# Patient Record
Sex: Male | Born: 1965 | Race: White | Hispanic: No | Marital: Married | Smoking: Never smoker
Health system: Southern US, Community
[De-identification: ages and names within clinical notes are randomized; demographics above are authoritative.]

## PROBLEM LIST (undated history)

## (undated) DIAGNOSIS — T7840XA Allergy, unspecified, initial encounter: Secondary | ICD-10-CM

## (undated) DIAGNOSIS — K219 Gastro-esophageal reflux disease without esophagitis: Secondary | ICD-10-CM

## (undated) DIAGNOSIS — B019 Varicella without complication: Secondary | ICD-10-CM

## (undated) HISTORY — DX: Allergy, unspecified, initial encounter: T78.40XA

## (undated) HISTORY — DX: Gastro-esophageal reflux disease without esophagitis: K21.9

## (undated) HISTORY — PX: TONSILLECTOMY: SUR1361

## (undated) HISTORY — DX: Varicella without complication: B01.9

---

## 2008-01-26 ENCOUNTER — Ambulatory Visit: Payer: Self-pay | Admitting: Occupational Medicine

## 2009-10-16 IMAGING — CR DG TOE GREAT 2+V*R*
2 series · 2 of 2 positions shown · non-contrast
Comparison: None

CLINICAL DATA: Pain, no acute injury

RIGHT TOE - 2+ VIEW

[view not recorded (1 of 2)]
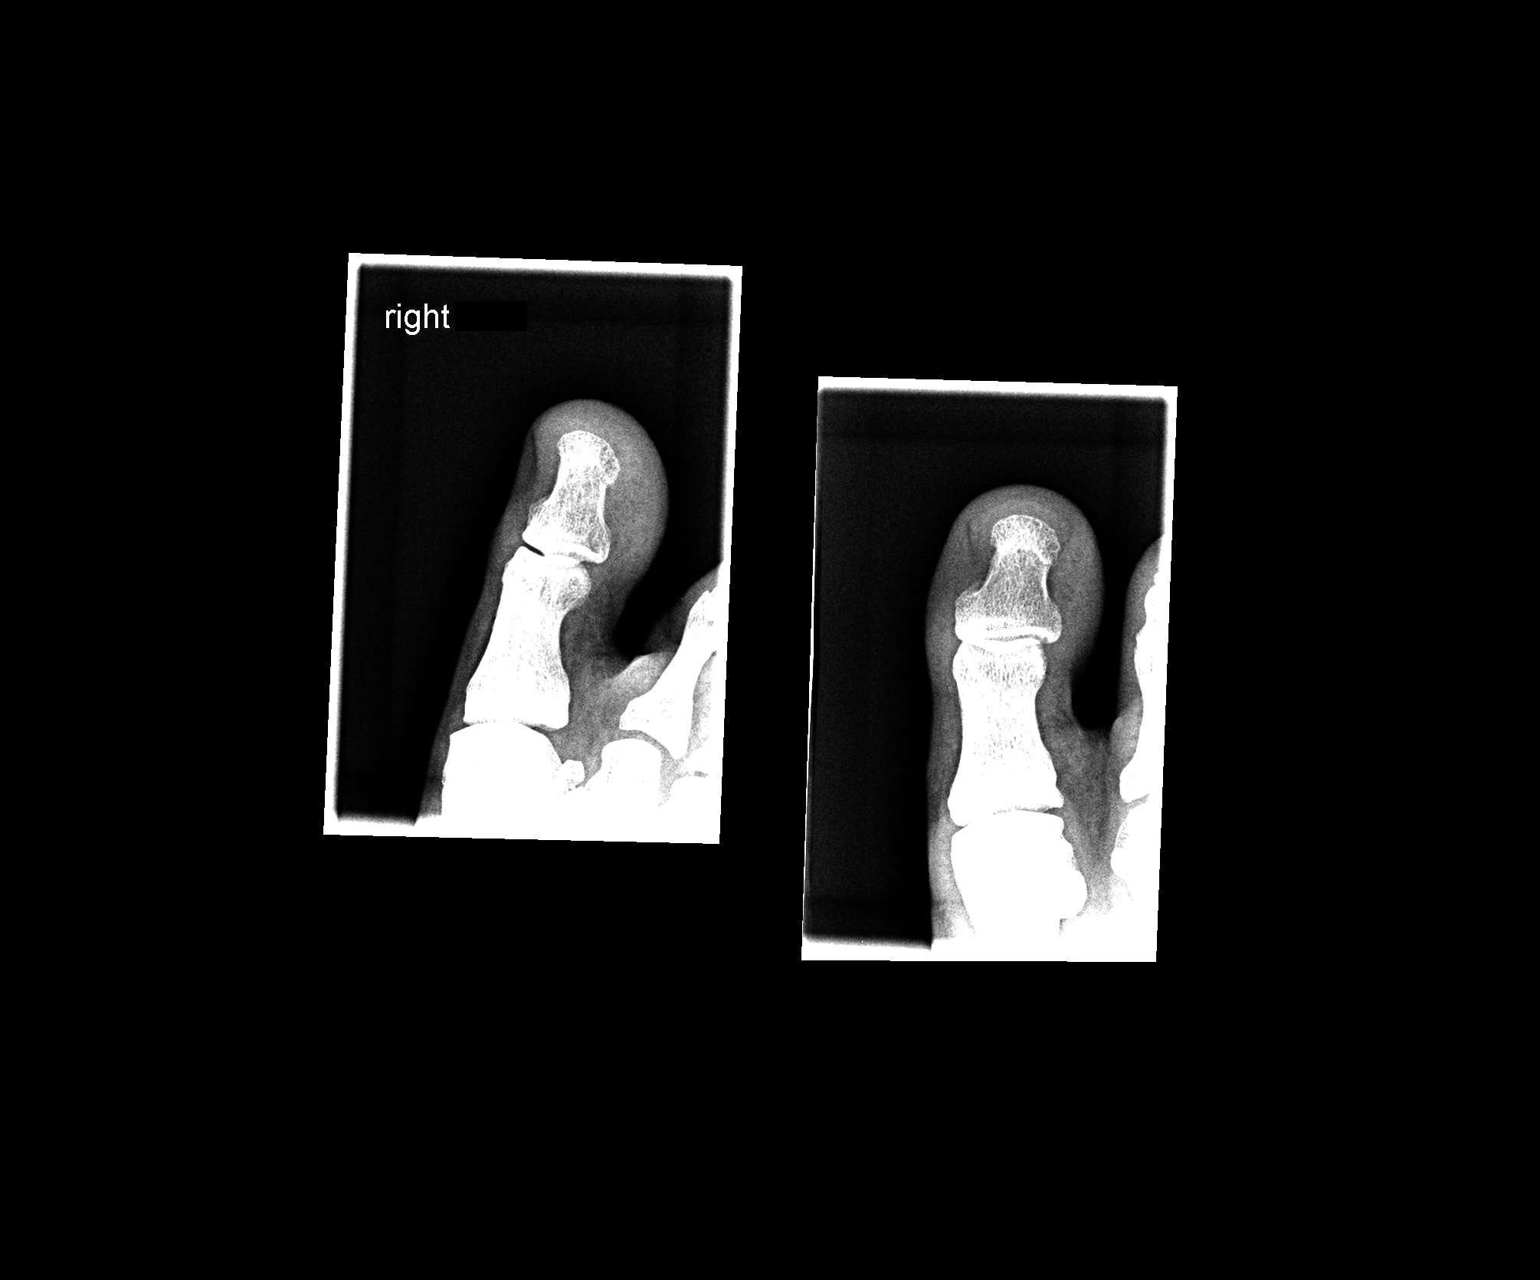

[view not recorded (2 of 2)]
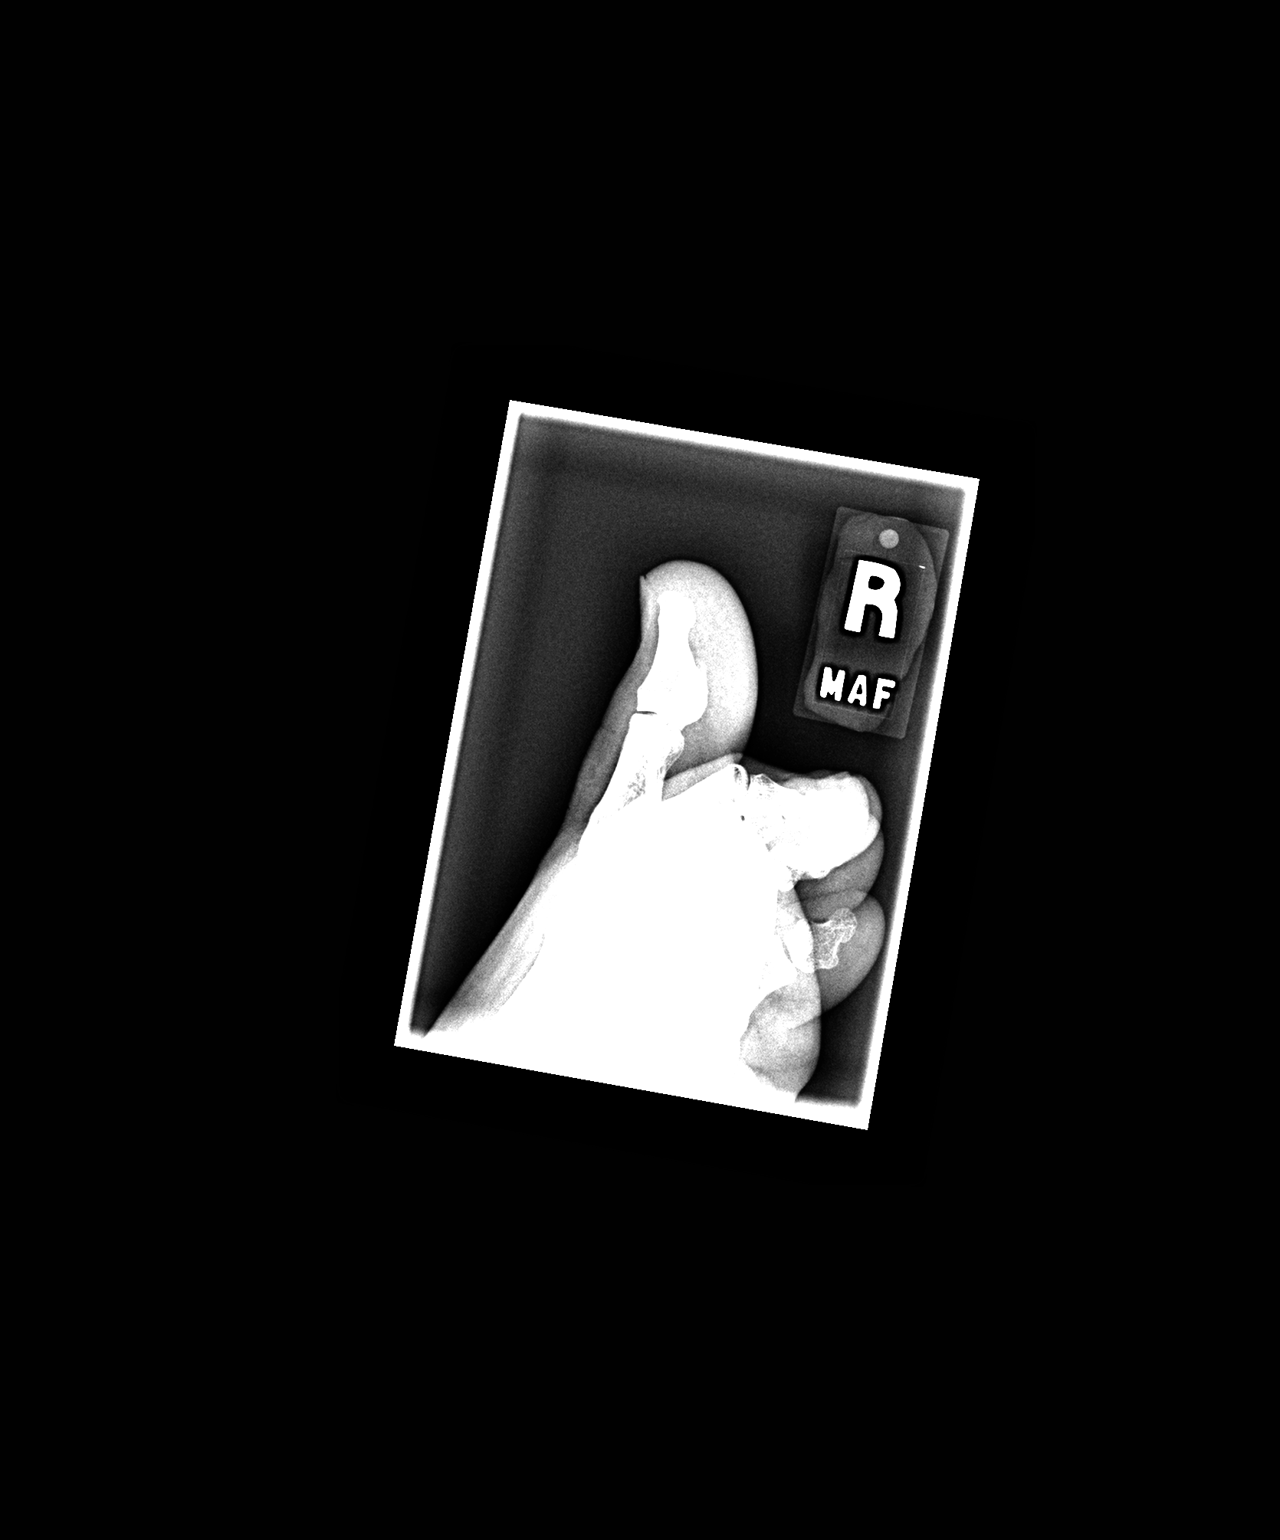

[2 of 2 positions shown; findings below may reference images not displayed]

FINDINGS: No acute bony abnormality is seen.  Alignment is normal.
Joint spaces are normal.  No erosion is seen.
IMPRESSION: Negative.

## 2015-05-18 ENCOUNTER — Ambulatory Visit (HOSPITAL_BASED_OUTPATIENT_CLINIC_OR_DEPARTMENT_OTHER)
Admission: RE | Admit: 2015-05-18 | Discharge: 2015-05-18 | Disposition: A | Discharge status: Home / Self Care | Payer: BLUE CROSS/BLUE SHIELD | Source: Ambulatory Visit | Attending: Medical | Admitting: Medical

## 2015-05-18 ENCOUNTER — Other Ambulatory Visit: Payer: Self-pay | Admitting: Medical

## 2015-05-18 ENCOUNTER — Telehealth: Payer: Self-pay | Admitting: Medical

## 2015-05-18 ENCOUNTER — Ambulatory Visit (INDEPENDENT_AMBULATORY_CARE_PROVIDER_SITE_OTHER): Payer: BLUE CROSS/BLUE SHIELD | Admitting: Medical

## 2015-05-18 ENCOUNTER — Encounter: Payer: Self-pay | Admitting: Medical

## 2015-05-18 VITALS — BP 124/84 | HR 96 | Temp 98.0°F | Resp 16 | Ht 70.0 in | Wt 204.0 lb

## 2015-05-18 DIAGNOSIS — M2550 Pain in unspecified joint: Secondary | ICD-10-CM

## 2015-05-18 DIAGNOSIS — M25852 Other specified joint disorders, left hip: Secondary | ICD-10-CM | POA: Diagnosis not present

## 2015-05-18 DIAGNOSIS — Z3009 Encounter for other general counseling and advice on contraception: Secondary | ICD-10-CM

## 2015-05-18 DIAGNOSIS — R0683 Snoring: Secondary | ICD-10-CM | POA: Diagnosis not present

## 2015-05-18 DIAGNOSIS — M25551 Pain in right hip: Secondary | ICD-10-CM

## 2015-05-18 DIAGNOSIS — R509 Fever, unspecified: Secondary | ICD-10-CM

## 2015-05-18 LAB — CBC WITH DIFFERENTIAL/PLATELET
BASOS ABS: 0.1 10*3/uL (ref 0.0–0.1)
Basophils Relative: 0.8 % (ref 0.0–3.0)
EOS ABS: 0.2 10*3/uL (ref 0.0–0.7)
Eosinophils Relative: 2 % (ref 0.0–5.0)
HEMATOCRIT: 43.2 % (ref 39.0–52.0)
Hemoglobin: 14.8 g/dL (ref 13.0–17.0)
LYMPHS PCT: 31.1 % (ref 12.0–46.0)
Lymphs Abs: 2.5 10*3/uL (ref 0.7–4.0)
MCHC: 34.2 g/dL (ref 30.0–36.0)
MCV: 89.7 fl (ref 78.0–100.0)
Monocytes Absolute: 0.7 10*3/uL (ref 0.1–1.0)
Monocytes Relative: 8.9 % (ref 3.0–12.0)
NEUTROS ABS: 4.7 10*3/uL (ref 1.4–7.7)
Neutrophils Relative %: 57.2 % (ref 43.0–77.0)
PLATELETS: 396 10*3/uL (ref 150.0–400.0)
RBC: 4.82 Mil/uL (ref 4.22–5.81)
RDW: 13.1 % (ref 11.5–15.5)
WBC: 8.2 10*3/uL (ref 4.0–10.5)

## 2015-05-18 LAB — URIC ACID: Uric Acid, Serum: 6 mg/dL (ref 4.0–7.8)

## 2015-05-18 LAB — C-REACTIVE PROTEIN: CRP: 4 mg/dL (ref 0.5–20.0)

## 2015-05-18 LAB — RHEUMATOID FACTOR

## 2015-05-18 LAB — SEDIMENTATION RATE: Sed Rate: 56 mm/hr — ABNORMAL HIGH (ref 0–22)

## 2015-05-18 MED ORDER — PREDNISONE 10 MG PO TABS: ORAL_TABLET | ORAL | Status: DC

## 2015-05-18 NOTE — Telephone Encounter (Signed)
rx prednisone sent in.

## 2015-05-18 NOTE — Progress Notes (Signed)
Pre visit review using our clinic review tool, if applicable. No additional management support is needed unless otherwise documented below in the visit note. 

## 2015-05-18 NOTE — Progress Notes (Addendum)
Subjective:    Patient ID: Alvin White, male    DOB: March 19, 1965, 50 y.o.   MRN: [id number]  HPI  I have reviewed pt PMH, PSH, FH, Social History and Surgical History  Works Tree [employer], No regular exercise, Likes to hike, drinks coffee about 2 cups a day. Pt eats relatively healthy. He eats fresh vegetable often at home. Married- 2 children. 15 yo and 51 yo.  Allergic rhinitis- mild seasonal both spring and fall.   Pt dad had rheumatoid arthritis. Also uncle had RA. Pt states rt great toe one year ago hurt a lot. But went away after 2 days. This past Tuesday he had some left hip pain. Wednesday walking with his [place] the zoo. Hurt for 3-4 days. Now pain gone but feels mild stiff today.   Rare heart burn. When he eats late at night.  Anxiety- mild low level and he considers normal variant.  Low level faint ha which he attributes to being fasting.  Pt states 2 years of snoring per wife. Pt states he wakes up feeling refreshed. But loud enough snore that keeps his wife up. When lying on his back it is the worse.  Pt also wants referral for vasectomy.   Pt states subjective fever on Friday. Temp 100.4. But now none. No rash on his hip. No cough. No urinary symptosm. No gi symptoms.    Review of Systems  Constitutional: Negative for fever, chills, diaphoresis, activity change and fatigue.  HENT: Negative for congestion, ear pain, sinus pressure and sore throat.   Respiratory: Negative for cough, chest tightness and shortness of breath.        Does snore.  Cardiovascular: Negative for chest pain, palpitations and leg swelling.  Gastrointestinal: Negative for nausea, vomiting and abdominal pain.  Genitourinary: Negative for dysuria and flank pain.  Musculoskeletal: Negative for myalgias, neck pain and neck stiffness.       Lt hip pain.  Skin: Negative for rash.  Neurological: Negative for dizziness, seizures, speech difficulty, weakness, numbness and headaches.    Hematological: Negative for adenopathy. Does not bruise/bleed easily.  Psychiatric/Behavioral: Negative for behavioral problems, confusion and agitation. The patient is not nervous/anxious.     Past Medical History  Diagnosis Date  . Chicken pox   . Allergy   . GERD (gastroesophageal reflux disease)      Social History   Social History  . Marital Status: Married    Spouse Name: N/A  . Number of Children: N/A  . Years of Education: N/A   Occupational History  . Not on file.   Social History Main Topics  . Smoking status: Never Smoker   . Smokeless tobacco: Never Used  . Alcohol Use: 1.2 - 1.8 oz/week    2-3 Standard drinks or equivalent per week  . Drug Use: No  . Sexual Activity: Not on file   Other Topics Concern  . Not on file   Social History Narrative  . No narrative on file    Past Surgical History  Procedure Laterality Date  . Tonsillectomy      Family History  Problem Relation Age of Onset  . Arthritis Mother   . Arthritis Father     No Known Allergies  No current outpatient prescriptions on file prior to visit.   No current facility-administered medications on file prior to visit.    BP 124/84 mmHg  Pulse 96  Temp(Src) 98 F (36.7 C) (Oral)  Resp 16  Ht 5\' 10"  (  1.778 m)  Wt 204 lb (92.534 kg)  BMI 29.27 kg/m2  SpO2 98%       Objective:   Physical Exam  General Mental Status- Alert. General Appearance- Not in acute distress.   Skin General: Color- Normal Color. Moisture- Normal Moisture.  Neck Carotid Arteries- Normal color. Moisture- Normal Moisture. No carotid bruits. No JVD.  Chest and Lung Exam Auscultation: Breath Sounds:-Normal. CTA.  Cardiovascular Auscultation:Rythm- Regular. Murmurs & Other Heart Sounds:Auscultation of the heart reveals- No Murmurs.  Abdomen Inspection:-Inspeection Normal. Palpation/Percussion:Note:No mass. Palpation and Percussion of the abdomen reveal- Non Tender, Non Distended + BS, no  rebound or guarding.    Neurologic Cranial Nerve exam:- CN III-XII intact(No nystagmus), symmetric smile. Strength:- 5/5 equal and symmetric strength both upper and lower extremities.  Lt hip- faint pain on palpation and rom.  Genital exam- no hernia on either side inguinal canals.      Assessment & Plan:  For your joint pain will get ra panel, uric acid and xray of lt hip. Currently [place] ibuprofen otc for pain.  For your fever on Friday will get cbc. If recurrent fever or new signs or symptoms let us know.  Will refer you to urologist for counseling on vasectomy.  Will refer to pulmonologist for evaluation of snoring.(possible sleep apnea)  Follow up 2-3 weeks for cpe.(come in fasting)

## 2015-05-18 NOTE — Addendum Note (Signed)
Addended by: Anabel Halon on: 05/18/2015 11:03 AM   Modules accepted: Orders

## 2015-05-18 NOTE — Patient Instructions (Addendum)
For your joint pain will get ra panel, uric acid and xray of lt hip. Currently [place] ibuprofen otc for pain.  For your fever on Friday will get cbc. If recurrent fever or new signs or symptoms let us know.  Will refer you to urologist for counseling on vasectomy.  Will refer to pulmonologist for evaluation of snoring.(possible sleep apnea)  Follow up 2-3 weeks for cpe.(come in fasting)

## 2015-05-19 ENCOUNTER — Telehealth: Payer: Self-pay | Admitting: Medical

## 2015-05-19 DIAGNOSIS — R700 Elevated erythrocyte sedimentation rate: Secondary | ICD-10-CM

## 2015-05-19 DIAGNOSIS — M2550 Pain in unspecified joint: Secondary | ICD-10-CM

## 2015-05-19 NOTE — Telephone Encounter (Signed)
Referral to rhuematologist made.

## 2015-05-19 NOTE — Telephone Encounter (Signed)
Which pulmonologist did you send the request to? Pt is curious. Who he will be seeing. Just talked to him and he is unaware who he is seening?

## 2015-05-19 NOTE — Telephone Encounter (Signed)
Pt minimizes pain in his hip now. I went over results with him. He does not want to take prednisone that I gave him. So I advised if hip pain worsens then go ahead and take. I will go ahead and refer to rheumatologist.

## 2015-05-19 NOTE — Telephone Encounter (Signed)
Pt spoke with pcp about results. He voices understanding.

## 2015-05-20 LAB — ANA: ANA: NEGATIVE

## 2015-06-24 ENCOUNTER — Telehealth: Payer: Self-pay | Admitting: Medical

## 2015-06-24 NOTE — Telephone Encounter (Signed)
I reviewed labs today ordered by Dr .Amil Amen. All studies were negative. Only mid lft eleavtion. Sign those labs and place for scanning.

## 2015-06-25 ENCOUNTER — Encounter: Payer: Self-pay | Admitting: Pulmonary Disease

## 2015-06-25 ENCOUNTER — Ambulatory Visit (INDEPENDENT_AMBULATORY_CARE_PROVIDER_SITE_OTHER): Payer: BLUE CROSS/BLUE SHIELD | Admitting: Pulmonary Disease

## 2015-06-25 VITALS — BP 128/82 | HR 93 | Ht 70.0 in | Wt 204.0 lb

## 2015-06-25 DIAGNOSIS — G4710 Hypersomnia, unspecified: Secondary | ICD-10-CM | POA: Diagnosis not present

## 2015-06-25 NOTE — Assessment & Plan Note (Signed)
Pt has snoring, gasping, choking, witnessed apneas.  Sleeps 7hrs/night. Feels some unrefreshed on waking up.  Some hypersomnia, esp end of the week.  (-) abn behavior in sleep.   Hypersomnia affects functionality.   ESS 5 currently but can get sleepy and tired end of the week.   We discussed about the diagnosis of Obstructive Sleep Apnea (OSA) and implications of untreated OSA. We discussed about CPAP and BiPaP as possible treatment options.   We will schedule the patient for a sleep study. We will try to get a home sleep study if insurance will cover.  If not covered, pt OK to do a lab study.    Patient was instructed to call the office if he/she has not heard back from the office 1-2 weeks after the sleep study.   Patient was instructed to call the office if he/she is having issues with the PAP device.   We discussed good sleep hygiene.   Patient was advised not to engage in activities requiring concentration and/or vigilance if he/she is sleepy.  Patient was advised not to drive if he/she is sleepy.

## 2015-06-25 NOTE — Patient Instructions (Signed)
It was a pleasure taking care of you today!  We will schedule you to have a sleep study to determine if you have sleep apnea.   We will get a home sleep test.  You will be instructed to come back to the office to get an apparatus to sleep with overnight.  Once we have the apparatus, it will usually take [place] 1-2 weeks to read the study and get back at you with results of the test.  Please give [place] a call in 2 weeks after your study if you do not hear back from [place].    If the sleep study is positive, we will order you a CPAP  machine.  Please call the office if you do NOT receive your machine in the next 1-2 weeks.   Please make sure you use your CPAP device everytime you sleep.  We will monitor the usage of your machine per your insurance requirement.  Your insurance company may take the machine from you if you are not using it regularly.   Please clean the mask, tubings, filter, water reservoir with soapy water every week.  Please use distilled water for the water reservoir.   Please call the office or your machine provider (DME company) if you are having issues with the device.   Return to clinic in 2-3 months with Dr. Corrie Dandy or NP

## 2015-06-25 NOTE — Progress Notes (Signed)
Subjective:    Patient ID: Alvin White, male    DOB: 07-24-1965, 50 y.o.   MRN: [id number]  HPI   This is the case of Alvin White, 50 y.o. Male, who was referred by Alvin White  in consultation regarding possible OSA.   As you very well know, patient is a non smoker, not known to have asthma or copd.  Pt has snoring, gasping, choking, witnessed apneas.  Sleeps 7hrs/night. Feels some unrefreshed on waking up.  Some hypersomnia, esp end of the week.  (-) abn behavior in sleep.   Hypersomnia affects functionality.   ESS 5      Review of Systems  Constitutional: Negative.  Negative for fever and unexpected weight change.  HENT: Negative.  Negative for congestion, dental problem, ear pain, nosebleeds, postnasal drip, rhinorrhea, sinus pressure, sneezing, sore throat and trouble swallowing.   Eyes: Negative.  Negative for redness and itching.  Respiratory: Negative.  Negative for cough, chest tightness, shortness of breath and wheezing.   Cardiovascular: Negative.  Negative for palpitations and leg swelling.  Gastrointestinal: Negative.  Negative for nausea and vomiting.  Endocrine: Negative.   Genitourinary: Negative.  Negative for dysuria.  Musculoskeletal: Positive for arthralgias. Negative for joint swelling.  Skin: Negative.  Negative for rash.  Allergic/Immunologic: Negative.   Neurological: Negative.  Negative for headaches.  Hematological: Negative.  Does not bruise/bleed easily.  Psychiatric/Behavioral: Negative.  Negative for dysphoric mood. The patient is not nervous/anxious.    Past Medical History  Diagnosis Date  . Chicken pox   . Allergy   . GERD (gastroesophageal reflux disease)    (-) DVT, CA  Family History  Problem Relation Age of Onset  . Arthritis Mother   . Arthritis Father    OSA in father.   Past Surgical History  Procedure Laterality Date  . Tonsillectomy      Social History   Social History  . Marital Status: Married   Spouse Name: N/A  . Number of Children: N/A  . Years of Education: N/A   Occupational History  . Not on file.   Social History Main Topics  . Smoking status: Never Smoker   . Smokeless tobacco: Never Used  . Alcohol Use: 1.2 - 1.8 oz/week    2-3 Standard drinks or equivalent per week  . Drug Use: No  . Sexual Activity: Not on file   Other Topics Concern  . 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   No Known Allergies   Outpatient Prescriptions Prior to Visit  Medication Sig Dispense Refill  . predniSONE (DELTASONE) 10 MG tablet 5 tab po day 1, 4 tab po day 2, 3 tab po day 3, 2 tab po day 4, 1 tab po day 5. 15 tablet 0   No facility-administered medications prior to visit.   No orders of the defined types were placed in this encounter.         Objective:   Physical Exam   Vitals:  Filed Vitals:   06/25/15 0931  BP: 128/82  Pulse: 93  Height: 5\' 10"  (1.778 m)  Weight: 204 lb (92.534 kg)  SpO2: 95%    Constitutional/General:  Pleasant, well-nourished, well-developed, not in any distress,  Comfortably seating.  Well kempt  Body mass index is 29.27 kg/(m^2). Wt Readings from Last 3 Encounters:  06/25/15 204 lb (92.534 kg)  05/18/15 204 lb (92.534 kg)  01/26/08 197 lb (89.359 kg)  Neck circumference: 15 inches  HEENT: Pupils equal and reactive to light and accommodation. Anicteric sclerae. Normal nasal mucosa.   No oral  lesions,  mouth clear,  oropharynx clear, no postnasal drip. (-) Oral thrush. No dental caries.  Airway - Mallampati class III  Neck: No masses. Midline trachea. No JVD, (-) LAD. (-) bruits appreciated.  Respiratory/Chest: Grossly normal chest. (-) deformity. (-) Accessory muscle use.  Symmetric expansion. (-) Tenderness on palpation.  Resonant on percussion.  Diminished BS on both lower lung zones. (-) wheezing, crackles, rhonchi (-) egophony  Cardiovascular: Regular  rate and  rhythm, heart sounds normal, no murmur or gallops, no peripheral edema  Gastrointestinal:  Normal bowel sounds. Soft, non-tender. No hepatosplenomegaly.  (-) masses.   Musculoskeletal:  Normal muscle tone. Normal gait.   Extremities: Grossly normal. (-) clubbing, cyanosis.  (-) edema  Skin: (-) rash,lesions seen.   Neurological/Psychiatric : alert, oriented to time, place, person. 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  Sleeps 7hrs/night. Feels some unrefreshed on waking up.  Some hypersomnia, esp end of the week.  (-) abn behavior in sleep.   Hypersomnia affects functionality.   ESS 5 currently but can get sleepy and tired end of the week.   We discussed about the diagnosis of Obstructive Sleep Apnea (OSA) and implications of untreated OSA. We discussed about CPAP and BiPaP as possible treatment options.   We will schedule the patient for a sleep study. We will try to get a home sleep study if insurance will cover.  If not covered, pt OK to do a lab study.    Patient was instructed to call the office if he/she has not heard back from the office 1-2 weeks after the sleep study.   Patient was instructed to call the office if he/she is having issues with the PAP device.   We discussed good sleep hygiene.   Patient was advised not to engage in activities requiring concentration and/or vigilance if he/she is sleepy.  Patient was advised not to drive if he/she is sleepy.       Thank you very much for letting me participate in this patient's care. Please do not hesitate to give me a call if you have any questions or concerns regarding the treatment plan.   Patient will follow up with me in 2-3 months    J. Shirl Harris, MD 06/25/2015   10:12 AM [facility] Pager: [phone] Office: [phone], Fax: [phone]

## 2015-07-01 ENCOUNTER — Other Ambulatory Visit: Payer: Self-pay | Admitting: Pulmonary Disease

## 2015-07-01 DIAGNOSIS — G4710 Hypersomnia, unspecified: Secondary | ICD-10-CM

## 2017-02-05 IMAGING — DX DG HIP (WITH OR WITHOUT PELVIS) 2-3V*L*
3 series · 3 of 3 positions shown · non-contrast
Comparison: None.

CLINICAL DATA: Pain for 1 week

EXAM:
DG HIP (WITH OR WITHOUT PELVIS) 2-3V LEFT

[pelvis ap]
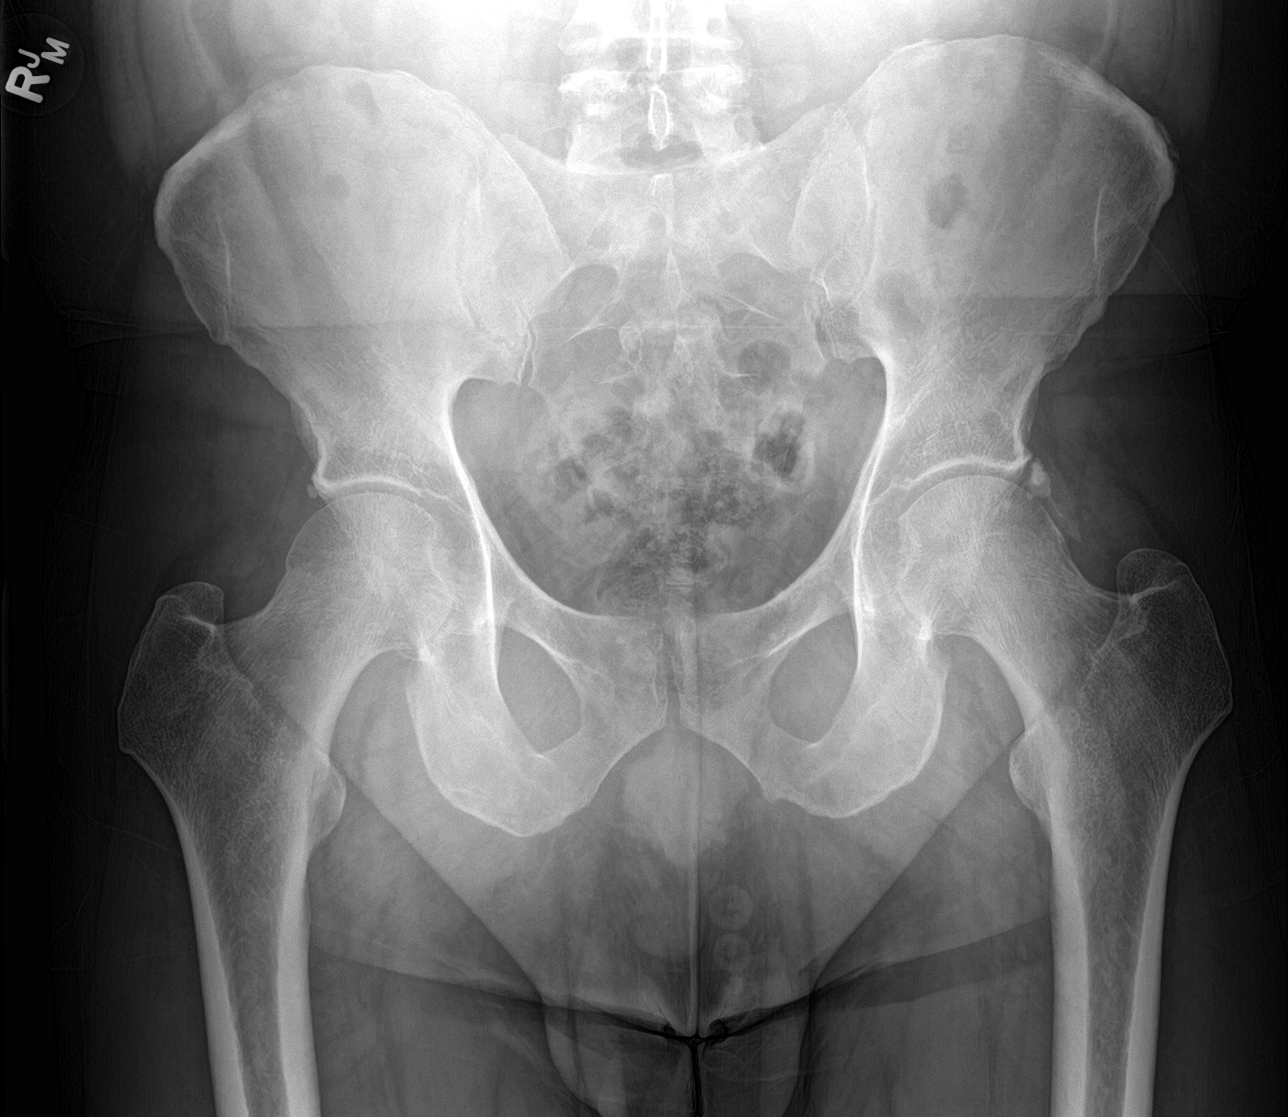

[hip ap]
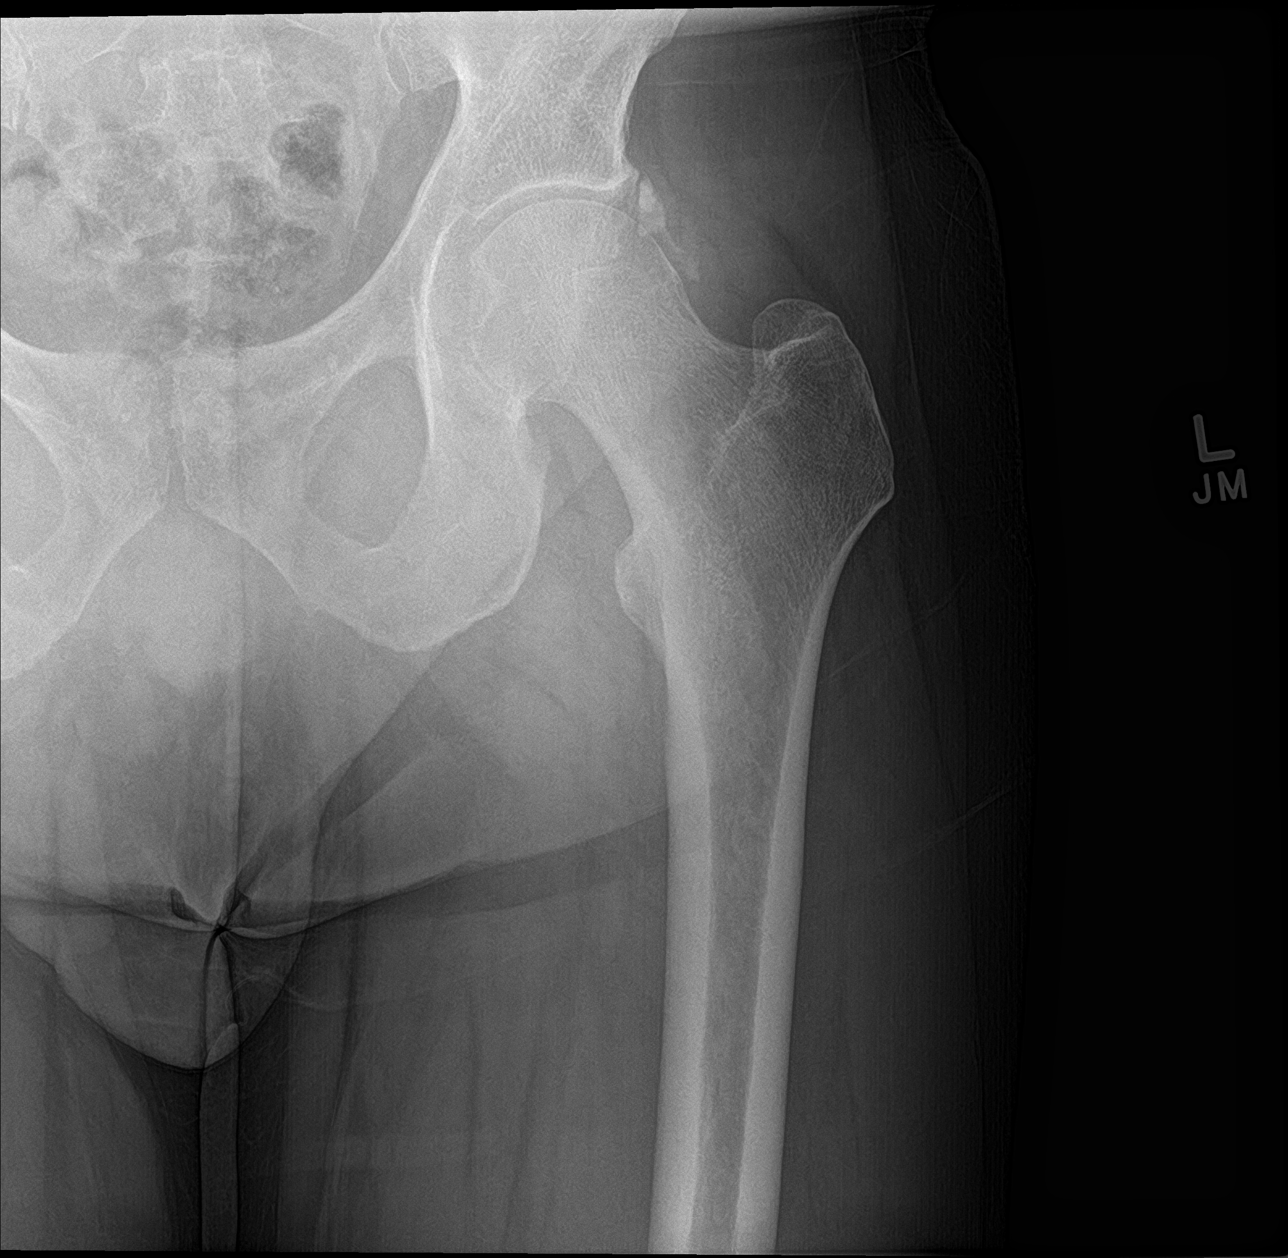

[hip lat]
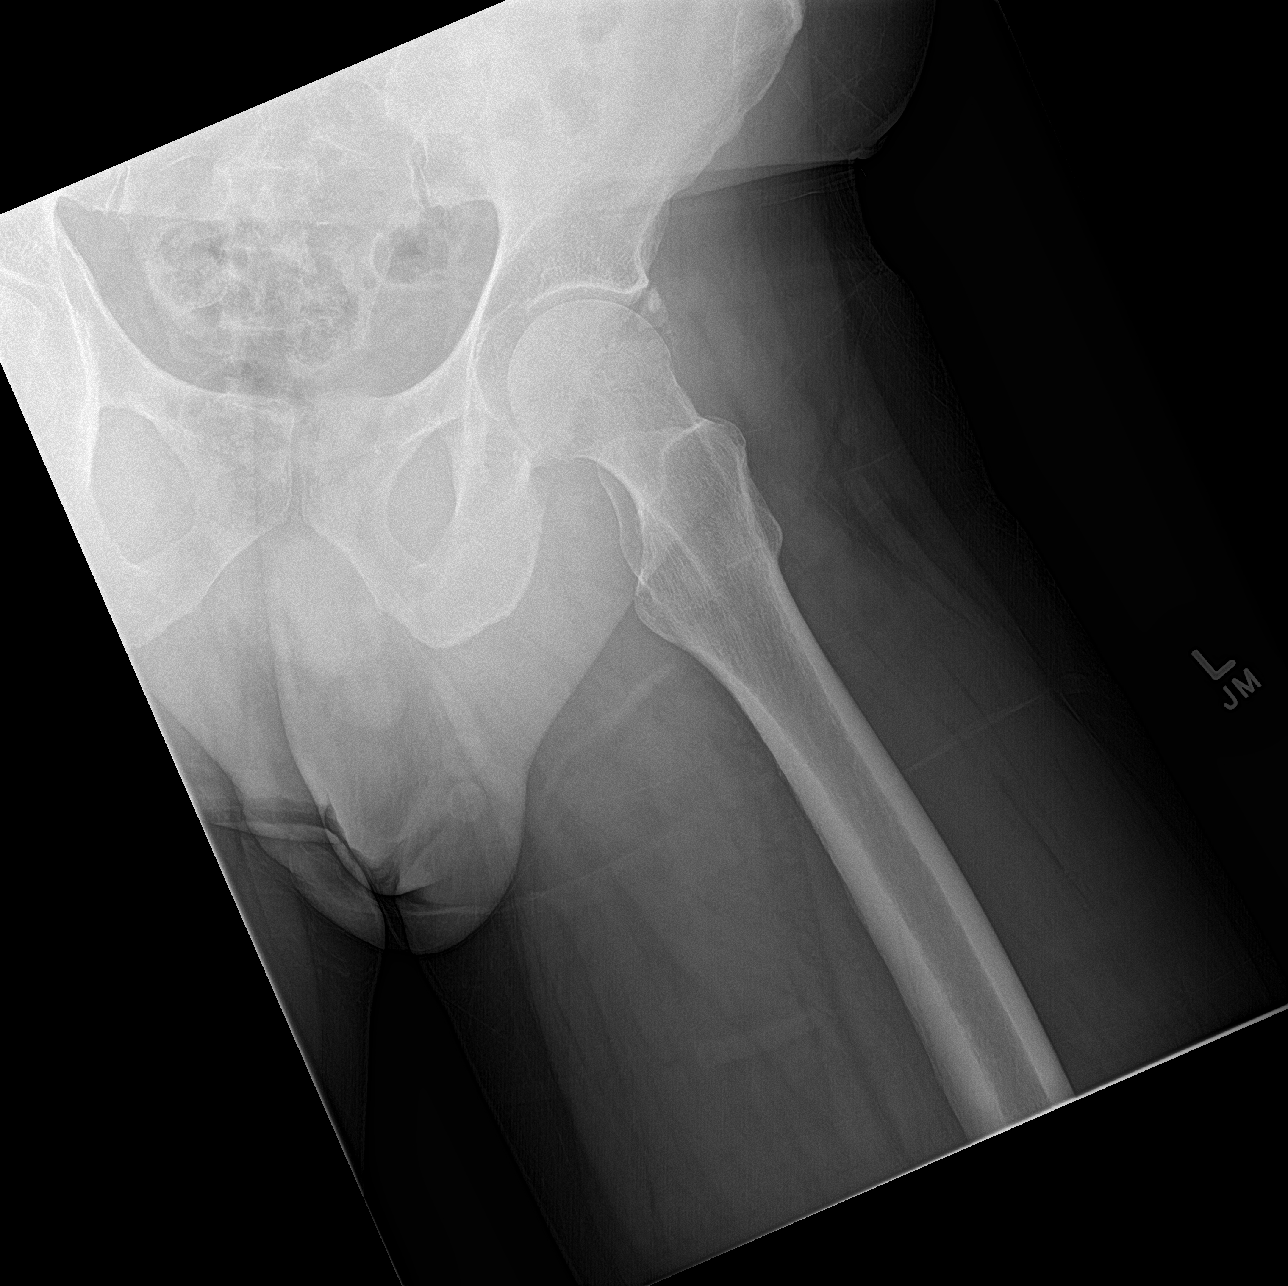

[3 of 3 positions shown; findings below may reference images not displayed]

FINDINGS: Frontal pelvis as well as frontal and lateral left hip images were
obtained. There is no fracture or dislocation. There is mild
intra-articular calcification in the lateral left hip joint region.
There is slight symmetric narrowing of both hip joints. No erosive
change.
IMPRESSION: Foci of calcification in the lateral left hip joint adjacent to the
superior acetabulum. Suspect arthropathic etiology, although residua
of prior trauma could present in this manner. No acute fracture or
dislocation. Slight symmetric narrowing of both hip joints.

## 2017-11-17 ENCOUNTER — Encounter: Payer: BLUE CROSS/BLUE SHIELD | Admitting: Medical

## 2018-04-23 ENCOUNTER — Ambulatory Visit (INDEPENDENT_AMBULATORY_CARE_PROVIDER_SITE_OTHER): Payer: BLUE CROSS/BLUE SHIELD | Admitting: Medical

## 2018-04-23 ENCOUNTER — Encounter: Payer: Self-pay | Admitting: Medical

## 2018-04-23 ENCOUNTER — Other Ambulatory Visit: Payer: Self-pay

## 2018-04-23 DIAGNOSIS — F419 Anxiety disorder, unspecified: Secondary | ICD-10-CM

## 2018-04-23 DIAGNOSIS — M79672 Pain in left foot: Secondary | ICD-10-CM

## 2018-04-23 DIAGNOSIS — Z1211 Encounter for screening for malignant neoplasm of colon: Secondary | ICD-10-CM

## 2018-04-23 DIAGNOSIS — J301 Allergic rhinitis due to pollen: Secondary | ICD-10-CM | POA: Diagnosis not present

## 2018-04-23 DIAGNOSIS — R12 Heartburn: Secondary | ICD-10-CM | POA: Diagnosis not present

## 2018-04-23 DIAGNOSIS — R0683 Snoring: Secondary | ICD-10-CM

## 2018-04-23 MED ORDER — LEVOCETIRIZINE DIHYDROCHLORIDE 5 MG PO TABS
5.0000 mg | ORAL_TABLET | Freq: Every evening | ORAL | 2 refills | Status: DC
Start: 2018-04-23 — End: 2018-06-26

## 2018-04-23 NOTE — Progress Notes (Signed)
Subjective:    Patient ID: Alvin White, male    DOB: Aug 29, 1965, 53 y.o.   MRN: [id number]  HPI Virtual Visit via Video Note  I connected with Alvin White on 04/23/18 at  8:40 AM EDT by a video enabled telemedicine application and verified that I am speaking with the correct person using two identifiers.   I discussed the limitations of evaluation and management by telemedicine and the availability of in person appointments. The patient expressed understanding and agreed to proceed.   History of Present Illness: I have reviewed pt PMH, PSH, FH, Social History and Surgical History  Works Tree surgeon, No regular exercise, Likes to hike, drinks coffee about 2 cups a day. Pt eats relatively healthy. He eats fresh vegetable often at home. Married- 2 children. 52 yo and 30 yo.  Allergic rhinitis- mild seasonal both spring and fall.   Pt dad had rheumatoid arthritis. Also uncle had RA. Pt states rt great toe one year ago hurt a lot. But went away after 2 days. This past Tuesday he had some left hip pain. Wednesday walking with his [place] the zoo. Hurt for 3-4 days. Now pain gone but feels mild stiff today.   Rare heart burn. When he eats late at night.  Anxiety- mild low level and he considers normal variant.      Observations/Objective: No acute distress.   Assessment and Plan: Good to see patient since it has been almost 3 years since her last visit.  Reviewed his no prior medical problems in the past.  He states recent seasonal allergy/pollen flare.  Describes mild flare so I went ahead and prescribe Xyzal antihistamine for him.  He declined Flonase nasal spray but I did also recommend nasal saline spray to clean out his nose after he does outside type yardwork.   He has history of rare occasional heartburn.  Advised healthy diet and he could use over-the-counter Pepcid if needed.  He still reports snoring so I did go ahead and make referral to  pulmonologist for sleep apnea evaluation.  Also he is aware that he is overdue for screening colonoscopy so I did place GI referral.  Explained to patient that we need to process referrals and see if specialist are doing a video visits prior to elective procedures that can be scheduled hopefully early summer.   For intermittent transient left foot pain, explained that he might have small neuroma or other diagnoses but difficult to say without evaluating the foot.  Explained that I could evaluate this on complete physical exam hopefully in the near future.  Also investigate whether or not insurances allow [place] to do virtual visit so we can get fasting lab work.  Follow-up date to be determined depending on if complete physical exams can be done virtually or when viral pandemic restrictions are eased up. Follow Up Instructions:    I discussed the assessment and treatment plan with the patient. The patient was provided an opportunity to ask questions and all were answered. The patient agreed with the plan and demonstrated an understanding of the instructions.   The patient was advised to call back or seek an in-person evaluation if the symptoms worsen or if the condition fails to improve as anticipated.  I provided 25+ minutes of non-face-to-face time during this encounter.   Mackie Pai, PA-C    Review of Systems  Constitutional: Negative for chills, fatigue and fever.  HENT: Positive for congestion, postnasal drip and sneezing. Negative for  drooling, ear pain, facial swelling, sinus pressure and sinus pain.        Snoring.  Respiratory: Negative for cough, chest tightness, shortness of breath and wheezing.   Cardiovascular: Negative for chest pain and palpitations.  Gastrointestinal: Negative for abdominal distention, abdominal pain, constipation, diarrhea and nausea.       Rare reflux.  Musculoskeletal: Negative for back pain, joint swelling, myalgias and neck stiffness.  Skin:  Negative for pallor and rash.  Neurological: Negative for dizziness, seizures, syncope, weakness and headaches.  Hematological: Negative for adenopathy. Does not bruise/bleed easily.  Psychiatric/Behavioral: Negative for behavioral problems, confusion, dysphoric mood and suicidal ideas. The patient is not nervous/anxious.        Objective:   Physical Exam  No acute distress      Assessment & Plan:

## 2018-04-23 NOTE — Patient Instructions (Signed)
Good to see patient since it has been almost 3 years since her last visit.  Reviewed his no prior medical problems in the past.  He states recent seasonal allergy/pollen flare.  Describes mild flare so I went ahead and prescribe Xyzal antihistamine for him.  He declined Flonase nasal spray but I did also recommend nasal saline spray to clean out his nose after he does outside type yardwork.  He has history of rare occasional heartburn.  Advised healthy diet and he could use over-the-counter Pepcid if needed.  He still reports snoring so I did go ahead and make referral to pulmonologist for sleep apnea evaluation.  Also he is aware that he is overdue for screening colonoscopy so I did place GI referral.  Explained to patient that we need to process referrals and see if specialist are doing a video visits prior to elective procedures that can be scheduled hopefully early summer.   For intermittent transient left foot pain, explained that he might have small neuroma or other diagnoses but difficult to say without evaluating the foot.  Explained that I could evaluate this on complete physical exam hopefully in the near future.  Also investigate whether or not insurances allow [place] to do virtual visit so we can get fasting lab work.  Follow-up date to be determined depending on if complete physical exams can be done virtually or when viral pandemic restrictions are eased up.

## 2018-06-26 ENCOUNTER — Other Ambulatory Visit: Payer: Self-pay

## 2018-06-26 ENCOUNTER — Ambulatory Visit: Payer: BLUE CROSS/BLUE SHIELD | Admitting: *Deleted

## 2018-06-26 VITALS — Ht 70.5 in | Wt 205.0 lb

## 2018-06-26 DIAGNOSIS — Z1211 Encounter for screening for malignant neoplasm of colon: Secondary | ICD-10-CM

## 2018-06-26 MED ORDER — NA SULFATE-K SULFATE-MG SULF 17.5-3.13-1.6 GM/177ML PO SOLN: ORAL | 0 refills | Status: DC

## 2018-06-26 NOTE — Progress Notes (Signed)
Patient's pre-visit was done today over the phone with the patient due to COVID-19 pandemic. Name,DOB and address verified. Insurance verified. Packet of Prep instructions mailed to patient including copy of a consent form and pre-procedure patient acknowledgement form-pt is aware. Suprep Coupon included. Patient understands to call us back with any questions or concerns.  Patient denies any allergies to eggs or soy. Patient denies any problems with anesthesia/sedation. Patient denies any oxygen use at home. Patient denies taking any diet/weight loss medications or blood thinners. EMMI education assisgned to patient on colonoscopy, this was explained and instructions given to patient.  Pt is aware that care partner will wait in the car during parking lot; if they feel like they will be too hot to wait in the car; they may wait in the lobby.  We want them to wear a mask (we do not have any that we can provide them), practice social distancing, and we will check their temperatures when they get here.  I did remind patient that their care partner needs to stay in the parking lot the entire time. Pt will wear mask into building

## 2018-07-17 ENCOUNTER — Telehealth: Payer: Self-pay | Admitting: Gastroenterology

## 2018-07-17 NOTE — Telephone Encounter (Signed)

## 2018-07-18 ENCOUNTER — Encounter: Payer: Self-pay | Admitting: Gastroenterology

## 2018-07-18 ENCOUNTER — Ambulatory Visit (AMBULATORY_SURGERY_CENTER): Payer: BLUE CROSS/BLUE SHIELD | Admitting: Gastroenterology

## 2018-07-18 ENCOUNTER — Other Ambulatory Visit: Payer: Self-pay

## 2018-07-18 VITALS — BP 154/102 | HR 74 | Temp 98.3°F | Resp 19 | Ht 70.0 in | Wt 204.0 lb

## 2018-07-18 DIAGNOSIS — D123 Benign neoplasm of transverse colon: Secondary | ICD-10-CM | POA: Diagnosis not present

## 2018-07-18 DIAGNOSIS — D125 Benign neoplasm of sigmoid colon: Secondary | ICD-10-CM | POA: Diagnosis not present

## 2018-07-18 DIAGNOSIS — Z1211 Encounter for screening for malignant neoplasm of colon: Secondary | ICD-10-CM

## 2018-07-18 DIAGNOSIS — K5730 Diverticulosis of large intestine without perforation or abscess without bleeding: Secondary | ICD-10-CM

## 2018-07-18 DIAGNOSIS — D128 Benign neoplasm of rectum: Secondary | ICD-10-CM | POA: Diagnosis not present

## 2018-07-18 DIAGNOSIS — K641 Second degree hemorrhoids: Secondary | ICD-10-CM

## 2018-07-18 MED ORDER — SODIUM CHLORIDE 0.9 % IV SOLN: 500.0000 mL | Freq: Once | INTRAVENOUS | Status: DC

## 2018-07-18 NOTE — Op Note (Signed)
[place] Patient Name: Alvin White Procedure Date: 07/18/2018 7:49 AM MRN: [id number] Endoscopist: Gerrit Heck , MD Age: 53 Referring MD:  Date of Birth: May 03, 1965 Gender: Male Account #: [id number] Procedure:                Colonoscopy Indications:              Screening for colorectal malignant neoplasm, This                            is the patient's first colonoscopy Medicines:                Monitored Anesthesia Care Procedure:                Pre-Anesthesia Assessment:                           - Prior to the procedure, a History and Physical                            was performed, and patient medications and                            allergies were reviewed. The patient's tolerance of                            previous anesthesia was also reviewed. The risks                            and benefits of the procedure and the sedation                            options and risks were discussed with the patient.                            All questions were answered, and informed consent                            was obtained. Prior Anticoagulants: The patient has                            taken no previous anticoagulant or antiplatelet                            agents. ASA Grade Assessment: II - A patient with                            mild systemic disease. After reviewing the risks                            and benefits, the patient was deemed in                            satisfactory condition to undergo the procedure.  After obtaining informed consent, the colonoscope                            was passed under direct vision. Throughout the                            procedure, the patient's blood pressure, pulse, and                            oxygen saturations were monitored continuously. The                            Colonoscope was introduced through the anus and                            advanced to the the terminal  ileum. The colonoscopy                            was technically difficult and complex due to a                            tortuous colon. Successful completion of the                            procedure was aided by using manual pressure. The                            patient tolerated the procedure well. The quality                            of the bowel preparation was adequate. The terminal                            ileum, ileocecal valve, appendiceal orifice, and                            rectum were photographed. Scope In: 8:15:44 AM Scope Out: 8:47:23 AM Scope Withdrawal Time: 0 hours 16 minutes 42 seconds  Total Procedure Duration: 0 hours 31 minutes 39 seconds  Findings:                 Hemorrhoids were found on perianal exam.                           A 18 mm polyp was found in the sigmoid colon. The                            polyp was pedunculated. The polyp was removed with                            a hot snare and retieved using a Roth net.                            Resection and  retrieval were complete. Estimated                            blood loss: none.                           A 5 mm polyp was found in the transverse colon. The                            polyp was sessile. The polyp was removed with a                            cold snare. Resection and retrieval were complete.                            Estimated blood loss was minimal.                           A 3 mm polyp was found in the rectum. The polyp was                            sessile. The polyp was removed with a cold snare.                            Resection and retrieval were complete. Estimated                            blood loss was minimal.                           Multiple small and large-mouthed diverticula were                            found in the sigmoid colon.                           The sigmoid colon was grossly tortuous in a field                            of dense  diverticulosis. Advancing the scope                            required using manual pressure.                           Non-bleeding internal hemorrhoids were found during                            retroflexion. The hemorrhoids were small.                           The terminal ileum appeared normal. Complications:            No immediate complications. Estimated Blood Loss:     Estimated blood loss was minimal. Impression:               -  Hemorrhoids found on perianal exam.                           - One 18 mm polyp in the sigmoid colon, removed                            with a hot snare. Resected and retrieved.                           - One 5 mm polyp in the transverse colon, removed                            with a cold snare. Resected and retrieved.                           - One 3 mm polyp in the rectum, removed with a cold                            snare. Resected and retrieved.                           - Diverticulosis in the sigmoid colon.                           - Tortuous colon.                           - Non-bleeding internal hemorrhoids.                           - The examined portion of the ileum was normal. Recommendation:           - Patient has a contact number available for                            emergencies. The signs and symptoms of potential                            delayed complications were discussed with the                            patient. Return to normal activities tomorrow.                            Written discharge instructions were provided to the                            patient.                           - Resume previous diet.                           - Continue present medications.                           -  Await pathology results.                           - Repeat colonoscopy in 3 years for surveillance.                           - Return to GI clinic PRN.                           - Use fiber, for example Citrucel,  Fibercon, Konsyl                            or Metamucil.                           - Internal hemorrhoids were noted on this study and                            may be amenable to hemorrhoid band ligation. If you                            are interested in further treatment of these                            hemorrhoids with band ligation, please contact my                            clinic to set up an appointment for evaluation and                            treatment. Gerrit Heck, MD 07/18/2018 8:58:55 AM

## 2018-07-18 NOTE — Patient Instructions (Signed)
INFORMATION ON POLYPS , DIVERTICULOSIS , &HEMORRHOIDS GIVEN TO YOU TODAY  INFORMATION ON HEMORRHOID BANDING GIVEN TO YOU TODAY - MAY CALL OFFICE AND SET UP AN APPOINTMENT FOR BANDING IF INTERESTED   AWAIT PATHOLOGY RESULTS ON POLYPS REMOVED  USE FIBER SUCH AS METAMUCIL,CITRUCEL,FIBERCON,OR KONSYL  YOU HAD AN ENDOSCOPIC PROCEDURE TODAY AT [facility]:   Refer to the procedure report that was given to you for any specific questions about what was found during the examination.  If the procedure report does not answer your questions, please call your gastroenterologist to clarify.  If you requested that your care partner not be given the details of your procedure findings, then the procedure report has been included in a sealed envelope for you to review at your convenience later.  YOU SHOULD EXPECT: Some feelings of bloating in the abdomen. Passage of more gas than usual.  Walking can help get rid of the air that was put into your GI tract during the procedure and reduce the bloating. If you had a lower endoscopy (such as a colonoscopy or flexible sigmoidoscopy) you may notice spotting of blood in your stool or on the toilet paper. If you underwent a bowel prep for your procedure, you may not have a normal bowel movement for a few days.  Please Note:  You might notice some irritation and congestion in your nose or some drainage.  This is from the oxygen used during your procedure.  There is no need for concern and it should clear up in a day or so.  SYMPTOMS TO REPORT IMMEDIATELY:   Following lower endoscopy (colonoscopy or flexible sigmoidoscopy):  Excessive amounts of blood in the stool  Significant tenderness or worsening of abdominal pains  Swelling of the abdomen that is new, acute  Fever of 100F or higher    Black, tarry-looking stools  For urgent or emergent issues, a gastroenterologist can be reached at any hour by calling [phone].   DIET:  We do recommend  a small meal at first, but then you may proceed to your regular diet.  Drink plenty of fluids but you should avoid alcoholic beverages for 24 hours.  ACTIVITY:  You should plan to take it easy for the rest of today and you should NOT DRIVE or use heavy machinery until tomorrow (because of the sedation medicines used during the test).    FOLLOW UP: Our staff will call the number listed on your records 48-72 hours following your procedure to check on you and address any questions or concerns that you may have regarding the information given to you following your procedure. If we do not reach you, we will leave a message.  We will attempt to reach you two times.  During this call, we will ask if you have developed any symptoms of COVID 19. If you develop any symptoms (ie: fever, flu-like symptoms, shortness of breath, cough etc.) before then, please call [phone].  If you test positive for Covid 19 in the 2 weeks post procedure, please call and report this information to [place].    If any biopsies were taken you will be contacted by phone or by letter within the next 1-3 weeks.  Please call us at [phone] if you have not heard about the biopsies in 3 weeks.    SIGNATURES/CONFIDENTIALITY: You and/or your care partner have signed paperwork which will be entered into your electronic medical record.  These signatures attest to the fact that that the information above  on your After Visit Summary has been reviewed and is understood.  Full responsibility of the confidentiality of this discharge information lies with you and/or your care-partner.

## 2018-07-18 NOTE — Progress Notes (Signed)
A and O x3. Report to RN. Tolerated MAC anesthesia well.

## 2018-07-18 NOTE — Progress Notes (Signed)
Pt's states no medical or surgical changes since previsit or office visit.  [facility] CMA and Judson Roch Angle Karel RN vitals. SM

## 2018-07-20 ENCOUNTER — Encounter: Payer: Self-pay | Admitting: Gastroenterology

## 2018-07-20 ENCOUNTER — Telehealth: Payer: Self-pay

## 2018-07-20 NOTE — Telephone Encounter (Signed)
Covid-19 screening questions   Do you now or have you had a fever in the last 14 days? no  Do you have any respiratory symptoms of shortness of breath or cough now or in the last 14 days? no  Do you have any family members or close contacts with diagnosed or suspected Covid-19 in the past 14 days? no  Have you been tested for Covid-19 and found to be positive? no       Follow up Call-  Call back number 07/18/2018  Post procedure Call Back phone  # [phone]  Permission to leave phone message Yes  Some recent data might be hidden     Patient questions:  Do you have a fever, pain , or abdominal swelling? No. Pain Score  0 *  Have you tolerated food without any problems? Yes.    Have you been able to return to your normal activities? Yes.    Do you have any questions about your discharge instructions: Diet   No. Medications  No. Follow up visit  No.  Do you have questions or concerns about your Care? No.  Actions: * If pain score is 4 or above: No action needed, pain <4.

## 2020-01-21 ENCOUNTER — Emergency Department (INDEPENDENT_AMBULATORY_CARE_PROVIDER_SITE_OTHER)
Admission: EM | Admit: 2020-01-21 | Discharge: 2020-01-21 | Disposition: A | Discharge status: Home / Self Care | Payer: 59 | Source: Home / Self Care

## 2020-01-21 DIAGNOSIS — J988 Other specified respiratory disorders: Secondary | ICD-10-CM

## 2020-01-21 DIAGNOSIS — B9789 Other viral agents as the cause of diseases classified elsewhere: Secondary | ICD-10-CM

## 2020-01-21 NOTE — ED Provider Notes (Signed)
[name]    CSN: [id number] Arrival date & time: 01/21/20  0835      History   Chief Complaint Chief Complaint  Patient presents with  . Sore Throat  . Sneezing  . Cough    HPI Alvin White is a 55 y.o. male.   HPI  Patient presents with URI symptoms including cough, sore throat, headache,nasal congestion. Symptoms present x 5 days. Unknown of COVID exposure. COVID Vaccinated: Y Denies worrisome symptoms of shortness of breath, weakness, N&V, and chest pain. Past Medical History:  Diagnosis Date  . Allergy   . Chicken pox   . GERD (gastroesophageal reflux disease)     Patient Active Problem List   Diagnosis Date Noted  . Hypersomnia 06/25/2015    Past Surgical History:  Procedure Laterality Date  . TONSILLECTOMY         Home Medications    Prior to Admission medications   Not on File    Family History Family History  Problem Relation Age of Onset  . Arthritis Mother   . Arthritis Father   . Colon polyps Father   . Esophageal cancer Neg Hx   . Rectal cancer Neg Hx   . Stomach cancer Neg Hx   . Colon cancer Neg Hx     Social History Social History   Tobacco Use  . Smoking status: Never Smoker  . Smokeless tobacco: Never Used  Vaping Use  . Vaping Use: Never used  Substance Use Topics  . Alcohol use: Yes    Alcohol/week: 14.0 standard drinks    Types: 7 Cans of beer, 7 Glasses of wine per week  . Drug use: No     Allergies   Patient has no known allergies.   Review of Systems Review of Systems Pertinent negatives listed in HPI  Physical Exam Triage Vital Signs ED Triage Vitals  Enc Vitals Group     BP 01/21/20 0908 (!) 147/104     Pulse Rate 01/21/20 0908 97     Resp 01/21/20 0908 18     Temp 01/21/20 0908 98.8 F (37.1 C)     Temp Source 01/21/20 0908 Oral     SpO2 01/21/20 0908 98 %     Weight --      Height --      Head Circumference --      Peak Flow --      Pain Score 01/21/20 0910 0     Pain  Loc --      Pain Edu? --      Excl. in [place]? --    No data found.  Updated Vital Signs BP (!) 147/104 (BP Location: Right Arm)   Pulse 97   Temp 98.8 F (37.1 C) (Oral)   Resp 18   SpO2 98%   Visual Acuity Right Eye Distance:   Left Eye Distance:   Bilateral Distance:    Right Eye Near:   Left Eye Near:    Bilateral Near:     Physical Exam General appearance: alert, well developed, well nourished, cooperative  Head: Normocephalic, without obvious abnormality, atraumatic ENT: mild congestion present  Respiratory: Respirations even and unlabored, normal respiratory rate, CTABL Heart: rate and rhythm normal. No gallop or murmurs noted on exam  Extremities: No gross deformities Skin: Skin color, texture, turgor normal. No rashes seen  Psych: Appropriate mood and affect.  UC Treatments / Results  Labs (all labs ordered are listed, but only abnormal results are displayed)  Labs Reviewed  COVID-19, FLU A+B NAA    EKG   Radiology No results found.  Procedures Procedures (including critical care time)  Medications Ordered in UC Medications - No data to display  Initial Impression / Assessment and Plan / UC Course  I have reviewed the triage vital signs and the nursing notes.  Pertinent labs & imaging results that were available during my care of the patient were reviewed by me and considered in my medical decision making (see chart for details).     COVID/Flu test pending.  Patient has already quarantine as of today a total of 5 days therefore regardless of test result quarantine has  ended and mask wearing is indicated. Symptom management warranted only.  Manage fever with Tylenol and ibuprofen.  Nasal symptoms with over-the-counter antihistamines recommended.  Treatment per discharge medications/discharge instructions.  Red flags/ER precautions given. The most current CDC isolation/quarantine recommendation advised.  Final Clinical Impressions(s) / UC Diagnoses    Final diagnoses:  Viral respiratory illness     Discharge Instructions     Your COVID 19 results will be available in 3-5 days .Negative results are immediately resulted to Mychart. Positive results will receive a follow-up call from our clinic. If symptoms are present, I recommend home quarantine until results are known.     ED Prescriptions    None     PDMP not reviewed this encounter.   Scot Jun, FNP 01/21/20 1022

## 2020-01-21 NOTE — ED Triage Notes (Signed)
Pt c/o cough, sore throat, headache and sneezing since Friday. No known exposure.

## 2020-01-21 NOTE — Discharge Instructions (Addendum)
Your COVID 19 results will be available in 3-5 days. Negative results are immediately resulted to Mychart. Positive results will receive a follow-up call from our clinic. If symptoms are present, I recommend home quarantine until results are known.  °

## 2020-01-24 LAB — COVID-19, FLU A+B NAA
Influenza A, NAA: NOT DETECTED
Influenza B, NAA: NOT DETECTED
SARS-CoV-2, NAA: DETECTED — AB

## 2020-10-02 ENCOUNTER — Encounter: Payer: Self-pay | Admitting: Gastroenterology

## 2022-07-06 ENCOUNTER — Ambulatory Visit
Admission: EM | Admit: 2022-07-06 | Discharge: 2022-07-06 | Disposition: A | Discharge status: Home / Self Care | Payer: Commercial Managed Care - PPO | Attending: Family Medicine | Admitting: Family Medicine

## 2022-07-06 ENCOUNTER — Encounter: Payer: Self-pay | Admitting: Emergency Medicine

## 2022-07-06 DIAGNOSIS — J069 Acute upper respiratory infection, unspecified: Secondary | ICD-10-CM

## 2022-07-06 NOTE — ED Provider Notes (Signed)
[facility]    CSN: [id number] Arrival date & time: 07/06/22  4098      History   Chief Complaint Chief Complaint  Patient presents with   Cough    HPI Alvin White is a 57 y.o. male.   Patient reports that he returned from a trip to [place] two weeks ago.  While there he developed mild sinus congestion that he attributed to allergy, mild brief sore throat, fatigue and mild cough.  He denies fevers, chills, and sweats, myalgias, headache, pleuritic pain, and shortness of breath.  He states that he now generally feels well except for mild persistent fatigue because his non-productive cough awakens him at night.  He has had no GI or GU symptoms.  The history is provided by the patient.    Past Medical History:  Diagnosis Date   Allergy    Chicken pox    GERD (gastroesophageal reflux disease)     Patient Active Problem List   Diagnosis Date Noted   Hypersomnia 06/25/2015    Past Surgical History:  Procedure Laterality Date   TONSILLECTOMY         Home Medications    Prior to Admission medications   Not on File    Family History Family History  Problem Relation Age of Onset   Arthritis Mother    Arthritis Father    Colon polyps Father    Esophageal cancer Neg Hx    Rectal cancer Neg Hx    Stomach cancer Neg Hx    Colon cancer Neg Hx     Social History Social History   Tobacco Use   Smoking status: Never   Smokeless tobacco: Never  Vaping Use   Vaping Use: Never used  Substance Use Topics   Alcohol use: Yes    Alcohol/week: 14.0 standard drinks of alcohol    Types: 7 Cans of beer, 7 Glasses of wine per week   Drug use: No     Allergies   Patient has no known allergies.   Review of Systems Review of Systems No sore throat + cough No pleuritic pain No wheezing + nasal congestion No post-nasal drainage No sinus pain/pressure No itchy/red eyes No earache No hemoptysis No SOB No fever/chills No nausea No  vomiting No abdominal pain No diarrhea No urinary symptoms No skin rash + fatigue No myalgias No headache   Physical Exam Triage Vital Signs ED Triage Vitals  Enc Vitals Group     BP 07/06/22 0838 133/85     Pulse Rate 07/06/22 0838 90     Resp 07/06/22 0838 14     Temp 07/06/22 0838 98.2 F (36.8 C)     Temp Source 07/06/22 0838 Oral     SpO2 07/06/22 0838 97 %     Weight --      Height 07/06/22 0840 5\' 10"  (1.778 m)     Head Circumference --      Peak Flow --      Pain Score 07/06/22 0844 0     Pain Loc --      Pain Edu? --      Excl. in [place]? --    No data found.  Updated Vital Signs BP 133/85 (BP Location: Left Arm)   Pulse 90   Temp 98.2 F (36.8 C) (Oral)   Resp 14   Ht 5\' 10"  (1.778 m)   SpO2 97%   BMI 29.27 kg/m   Visual Acuity Right Eye Distance:  Left Eye Distance:   Bilateral Distance:    Right Eye Near:   Left Eye Near:    Bilateral Near:     Physical Exam Nursing notes and Vital Signs reviewed. Appearance:  Patient appears stated age, and in no acute distress Eyes:  Pupils are equal, round, and reactive to light and accomodation.  Extraocular movement is intact.  Conjunctivae are not inflamed  Ears:  Canals normal.  Tympanic membranes normal.  Nose:  Mildly congested turbinates.  No sinus tenderness.  Pharynx:  Normal Neck:  Supple.  No adenopathy.  Lungs:  Clear to auscultation.  Breath sounds are equal.  Moving air well. Heart:  Regular rate and rhythm without murmurs, rubs, or gallops.  Abdomen:  Nontender without masses or hepatosplenomegaly.  Bowel sounds are present.  No CVA or flank tenderness.  Extremities:  No edema.  Skin:  No rash present.   UC Treatments / Results  Labs (all labs ordered are listed, but only abnormal results are displayed) Labs Reviewed - No data to display  EKG   Radiology No results found.  Procedures Procedures (including critical care time)  Medications Ordered in UC Medications - No data to  display  Initial Impression / Assessment and Plan / UC Course  I have reviewed the triage vital signs and the nursing notes.  Pertinent labs & imaging results that were available during my care of the patient were reviewed by me and considered in my medical decision making (see chart for details).    Benign exam.  There is no evidence of bacterial infection today.  Suspect a post-infectious cough. Treat symptomatically for now. Call if fever develops or symptoms worsen.   Final Clinical Impressions(s) / UC Diagnoses   Final diagnoses:  Viral URI with cough     Discharge Instructions      Take plain guaifenesin (1200mg  extended release tabs such as Mucinex) twice daily, with plenty of water, for cough and congestion.  Get adequate rest.  May take Delsym Cough Suppressant ("12 Hour Cough Relief") at bedtime for nighttime cough.   For sinus congestion may use Afrin nasal spray (or generic oxymetazoline) each morning for about 5 days and then discontinue.  Also recommend using saline nasal spray several times daily and saline nasal irrigation (AYR is a common brand).  Use Flonase nasal spray each morning after using Afrin nasal spray and saline nasal irrigation. Stop all antihistamines for now, and other non-prescription cough/cold preparations.      ED Prescriptions   None       Lattie Haw, MD 07/06/22 [phone]

## 2022-07-06 NOTE — Discharge Instructions (Addendum)
Take plain guaifenesin (1200mg  extended release tabs such as Mucinex) twice daily, with plenty of water, for cough and congestion.  Get adequate rest.  May take Delsym Cough Suppressant ("12 Hour Cough Relief") at bedtime for nighttime cough.   For sinus congestion may use Afrin nasal spray (or generic oxymetazoline) each morning for about 5 days and then discontinue.  Also recommend using saline nasal spray several times daily and saline nasal irrigation (AYR is a common brand).  Use Flonase nasal spray each morning after using Afrin nasal spray and saline nasal irrigation. Stop all antihistamines for now, and other non-prescription cough/cold preparations.

## 2022-07-06 NOTE — ED Triage Notes (Signed)
Cough since returning from [place]  Returned from [place] 2 weeks ago  Pt also had congestion at that time too No meds for cough  Cough keeps patient awake at night

## 2022-07-20 ENCOUNTER — Encounter: Payer: Self-pay | Admitting: Medical

## 2022-07-20 ENCOUNTER — Ambulatory Visit: Payer: Commercial Managed Care - PPO | Admitting: Medical

## 2022-07-20 VITALS — BP 135/82 | HR 91 | Ht 70.0 in | Wt 205.4 lb

## 2022-07-20 DIAGNOSIS — Z125 Encounter for screening for malignant neoplasm of prostate: Secondary | ICD-10-CM

## 2022-07-20 DIAGNOSIS — Z23 Encounter for immunization: Secondary | ICD-10-CM | POA: Diagnosis not present

## 2022-07-20 DIAGNOSIS — Z1211 Encounter for screening for malignant neoplasm of colon: Secondary | ICD-10-CM

## 2022-07-20 DIAGNOSIS — Z0000 Encounter for general adult medical examination without abnormal findings: Secondary | ICD-10-CM

## 2022-07-20 LAB — CBC WITH DIFFERENTIAL/PLATELET
Basophils Absolute: 0.1 10*3/uL (ref 0.0–0.1)
Basophils Relative: 0.7 % (ref 0.0–3.0)
Eosinophils Absolute: 0.3 10*3/uL (ref 0.0–0.7)
Eosinophils Relative: 3.5 % (ref 0.0–5.0)
HCT: 46.6 % (ref 39.0–52.0)
Hemoglobin: 15.4 g/dL (ref 13.0–17.0)
Lymphocytes Relative: 38.6 % (ref 12.0–46.0)
Lymphs Abs: 3.5 10*3/uL (ref 0.7–4.0)
MCHC: 33.1 g/dL (ref 30.0–36.0)
MCV: 92.3 fl (ref 78.0–100.0)
Monocytes Absolute: 1.1 10*3/uL — ABNORMAL HIGH (ref 0.1–1.0)
Monocytes Relative: 12.4 % — ABNORMAL HIGH (ref 3.0–12.0)
Neutro Abs: 4.1 10*3/uL (ref 1.4–7.7)
Neutrophils Relative %: 44.8 % (ref 43.0–77.0)
Platelets: 338 10*3/uL (ref 150.0–400.0)
RBC: 5.05 Mil/uL (ref 4.22–5.81)
RDW: 13.5 % (ref 11.5–15.5)
WBC: 9.1 10*3/uL (ref 4.0–10.5)

## 2022-07-20 LAB — COMPREHENSIVE METABOLIC PANEL
ALT: 29 U/L (ref 0–53)
AST: 53 U/L — ABNORMAL HIGH (ref 0–37)
Albumin: 4.4 g/dL (ref 3.5–5.2)
Alkaline Phosphatase: 73 U/L (ref 39–117)
BUN: 16 mg/dL (ref 6–23)
CO2: 29 mEq/L (ref 19–32)
Calcium: 9.9 mg/dL (ref 8.4–10.5)
Chloride: 104 mEq/L (ref 96–112)
Creatinine, Ser: 1.01 mg/dL (ref 0.40–1.50)
GFR: 83.63 mL/min (ref >60.00)
Glucose, Bld: 77 mg/dL (ref 70–99)
Potassium: 4.1 mEq/L (ref 3.5–5.1)
Sodium: 141 mEq/L (ref 135–145)
Total Bilirubin: 0.5 mg/dL (ref 0.2–1.2)
Total Protein: 6.9 g/dL (ref 6.0–8.3)

## 2022-07-20 LAB — LIPID PANEL
Cholesterol: 242 mg/dL — ABNORMAL HIGH (ref 0–200)
HDL: 43.6 mg/dL (ref >39.00)
NonHDL: 197.93
Total CHOL/HDL Ratio: 6
Triglycerides: 228 mg/dL — ABNORMAL HIGH (ref 0.0–149.0)
VLDL: 45.6 mg/dL — ABNORMAL HIGH (ref 0.0–40.0)

## 2022-07-20 LAB — PSA: PSA: 0.83 ng/mL (ref 0.10–4.00)

## 2022-07-20 LAB — LDL CHOLESTEROL, DIRECT: Direct LDL: 159 mg/dL

## 2022-07-20 NOTE — Progress Notes (Signed)
Subjective:    Patient ID: Alvin White, male    DOB: [birth year], 57 y.o.   MRN: [id number]  HPI Pt in for first time.(Decided to go ahead and do welllness exam)  Pt works for [employer]. He does not exercise regularly. He states eats healthy overall. Non smoker. Alcohol- 2 beers a day 5 days a week. 2 cups of coffee a day.   Pt has not had shingles vaccine. Needs tdap. He will get shingrix today and wants to get tdap later date.  Pt is due for colonoscopy.   Occasional self limited gerd. Controlled if eats healthy.  Allergies- minimal seasonal.   Review of Systems  Constitutional:  Negative for chills, fatigue and fever.  HENT:  Negative for dental problem.   Respiratory:  Negative for cough, chest tightness, shortness of breath, wheezing and stridor.   Cardiovascular:  Negative for chest pain and palpitations.  Gastrointestinal:  Negative for abdominal pain, anal bleeding, blood in stool, constipation and nausea.  Genitourinary:  Negative for dysuria, flank pain and frequency.  Musculoskeletal:  Negative for back pain, myalgias, neck pain and neck stiffness.  Skin:  Negative for pallor and rash.  Neurological:  Negative for dizziness, speech difficulty, numbness and headaches.  Hematological:  Negative for adenopathy. Does not bruise/bleed easily.  Psychiatric/Behavioral:  Negative for behavioral problems, decreased concentration, dysphoric mood and sleep disturbance. The patient is not nervous/anxious and is not hyperactive.     Past Medical History:  Diagnosis Date   Allergy    Chicken pox    GERD (gastroesophageal reflux disease)      Social History   Socioeconomic History   Marital status: Married    Spouse name: Not on file   Number of children: Not on file   Years of education: Not on file   Highest education level: Not on file  Occupational History   Not on file  Tobacco Use   Smoking status: Never   Smokeless tobacco: Never  Vaping Use    Vaping Use: Never used  Substance and Sexual Activity   Alcohol use: Yes    Alcohol/week: 14.0 standard drinks of alcohol    Types: 7 Cans of beer, 7 Glasses of wine per week   Drug use: No   Sexual activity: Not on file  Other Topics Concern   Not on file  Social History Narrative   Not on file   Social Determinants of Health   Financial Resource Strain: Not on file  Food Insecurity: Not on file  Transportation Needs: Not on file  Physical Activity: Not on file  Stress: Not on file  Social Connections: Not on file  Intimate Partner Violence: Not on file    Past Surgical History:  Procedure Laterality Date   TONSILLECTOMY      Family History  Problem Relation Age of Onset   Arthritis Mother    Arthritis Father    Colon polyps Father    Esophageal cancer Neg Hx    Rectal cancer Neg Hx    Stomach cancer Neg Hx    Colon cancer Neg Hx     No Known Allergies  No current outpatient medications on file prior to visit.   No current facility-administered medications on file prior to visit.    BP 135/82 (BP Location: Right Arm, Patient Position: Sitting, Cuff Size: Large)   Pulse 91   Ht 5\' 10"  (1.778 m)   Wt 205 lb 6.4 oz (93.2 kg)   SpO2 98%  BMI 29.47 kg/m        Objective:   Physical Exam  General Mental Status- Alert. General Appearance- Not in acute distress.   Skin General: Color- Normal Color. Moisture- Normal Moisture.  Neck Carotid Arteries- Normal color. Moisture- Normal Moisture. No carotid bruits. No JVD.  Chest and Lung Exam Auscultation: Breath Sounds:-Normal.  Cardiovascular Auscultation:Rythm- Regular. Murmurs & Other Heart Sounds:Auscultation of the heart reveals- No Murmurs.  Abdomen Inspection:-Inspeection Normal. Palpation/Percussion:Note:No mass. Palpation and Percussion of the abdomen reveal- Non Tender, Non Distended + BS, no rebound or guarding.   Neurologic Cranial Nerve exam:- CN III-XII intact(No nystagmus),  symmetric smile. Strength:- 5/5 equal and symmetric strength both upper and lower extremities.       Assessment & Plan:   Patient Instructions  For you wellness exam today I have ordered cbc, cmp and  lipid panel.  Shingrix vaccine today. You want to do tdap later. Can do that as nurse.  Recommend exercise and healthy diet.  We will let you know lab results as they come in.  Follow up date appointment will be determined after lab review.    Referral to GI placed. You can call them directly since you are overdue.     Esperanza Richters, PA-C

## 2022-07-20 NOTE — Addendum Note (Signed)
Addended by: Judieth Keens on: 07/20/2022 09:05 AM   Modules accepted: Orders

## 2022-07-20 NOTE — Patient Instructions (Addendum)
For you wellness exam today I have ordered cbc, cmp, psa  and lipid panel.  Shingrix vaccine today. You want to do tdap later. Can do that as nurse.  Recommend exercise and healthy diet.  We will let you know lab results as they come in.  Follow up date appointment will be determined after lab review.    Referral to GI placed. You can call them directly since you are overdue.  Preventive Care 39-56 Years Old, Male Preventive care refers to lifestyle choices and visits with your health care provider that can promote health and wellness. Preventive care visits are also called wellness exams. What can I expect for my preventive care visit? Counseling During your preventive care visit, your health care provider may ask about your: Medical history, including: Past medical problems. Family medical history. Current health, including: Emotional well-being. Home life and relationship well-being. Sexual activity. Lifestyle, including: Alcohol, nicotine or tobacco, and drug use. Access to firearms. Diet, exercise, and sleep habits. Safety issues such as seatbelt and bike helmet use. Sunscreen use. Work and work Astronomer. Physical exam Your health care provider will check your: Height and weight. These may be used to calculate your BMI (body mass index). BMI is a measurement that tells if you are at a healthy weight. Waist circumference. This measures the distance around your waistline. This measurement also tells if you are at a healthy weight and may help predict your risk of certain diseases, such as type 2 diabetes and high blood pressure. Heart rate and blood pressure. Body temperature. Skin for abnormal spots. What immunizations do I need?  Vaccines are usually given at various ages, according to a schedule. Your health care provider will recommend vaccines for you based on your age, medical history, and lifestyle or other factors, such as travel or where you work. What tests do  I need? Screening Your health care provider may recommend screening tests for certain conditions. This may include: Lipid and cholesterol levels. Diabetes screening. This is done by checking your blood sugar (glucose) after you have not eaten for a while (fasting). Hepatitis B test. Hepatitis C test. HIV (human immunodeficiency virus) test. STI (sexually transmitted infection) testing, if you are at risk. Lung cancer screening. Prostate cancer screening. Colorectal cancer screening. Talk with your health care provider about your test results, treatment options, and if necessary, the need for more tests. Follow these instructions at home: Eating and drinking  Eat a diet that includes fresh fruits and vegetables, whole grains, lean protein, and low-fat dairy products. Take vitamin and mineral supplements as recommended by your health care provider. Do not drink alcohol if your health care provider tells you not to drink. If you drink alcohol: Limit how much you have to 0-2 drinks a day. Know how much alcohol is in your drink. In the [place] one drink equals one 12 oz bottle of beer (355 mL), one 5 oz glass of wine (148 mL), or one 1 oz glass of hard liquor (44 mL). Lifestyle Brush your teeth every morning and night with fluoride toothpaste. Floss one time each day. Exercise for at least 30 minutes 5 or more days each week. Do not use any products that contain nicotine or tobacco. These products include cigarettes, chewing tobacco, and vaping devices, such as e-cigarettes. If you need help quitting, ask your health care provider. Do not use drugs. If you are sexually active, practice safe sex. Use a condom or other form of protection to prevent STIs. Take aspirin  only as told by your health care provider. Make sure that you understand how much to take and what form to take. Work with your health care provider to find out whether it is safe and beneficial for you to take aspirin daily. Find  healthy ways to manage stress, such as: Meditation, yoga, or listening to music. Journaling. Talking to a trusted person. Spending time with friends and family. Minimize exposure to UV radiation to reduce your risk of skin cancer. Safety Always wear your seat belt while driving or riding in a vehicle. Do not drive: If you have been drinking alcohol. Do not ride with someone who has been drinking. When you are tired or distracted. While texting. If you have been using any mind-altering substances or drugs. Wear a helmet and other protective equipment during sports activities. If you have firearms in your house, make sure you follow all gun safety procedures. What's next? Go to your health care provider once a year for an annual wellness visit. Ask your health care provider how often you should have your eyes and teeth checked. Stay up to date on all vaccines. This information is not intended to replace advice given to you by your health care provider. Make sure you discuss any questions you have with your health care provider. Document Revised: 06/24/2020 Document Reviewed: 06/24/2020 Elsevier Patient Education  2024 [employer].

## 2023-04-20 ENCOUNTER — Encounter: Payer: Self-pay | Admitting: Gastroenterology

## 2023-06-12 ENCOUNTER — Ambulatory Visit (AMBULATORY_SURGERY_CENTER)

## 2023-06-12 VITALS — Ht 70.0 in | Wt 200.0 lb

## 2023-06-12 DIAGNOSIS — Z860100 Personal history of colon polyps, unspecified: Secondary | ICD-10-CM

## 2023-06-12 MED ORDER — NA SULFATE-K SULFATE-MG SULF 17.5-3.13-1.6 GM/177ML PO SOLN: 1.0000 | Freq: Once | ORAL | 0 refills | Status: AC

## 2023-06-12 NOTE — Progress Notes (Signed)

## 2023-07-03 ENCOUNTER — Encounter: Payer: Self-pay | Admitting: Gastroenterology

## 2023-07-03 ENCOUNTER — Ambulatory Visit (AMBULATORY_SURGERY_CENTER): Admitting: Gastroenterology

## 2023-07-03 VITALS — BP 144/96 | HR 93 | Temp 99.1°F | Resp 19 | Ht 70.0 in | Wt 200.0 lb

## 2023-07-03 DIAGNOSIS — K5730 Diverticulosis of large intestine without perforation or abscess without bleeding: Secondary | ICD-10-CM | POA: Diagnosis not present

## 2023-07-03 DIAGNOSIS — Z860101 Personal history of adenomatous and serrated colon polyps: Secondary | ICD-10-CM

## 2023-07-03 DIAGNOSIS — K641 Second degree hemorrhoids: Secondary | ICD-10-CM | POA: Diagnosis not present

## 2023-07-03 DIAGNOSIS — Z1211 Encounter for screening for malignant neoplasm of colon: Secondary | ICD-10-CM | POA: Diagnosis present

## 2023-07-03 DIAGNOSIS — Z860100 Personal history of colon polyps, unspecified: Secondary | ICD-10-CM

## 2023-07-03 MED ORDER — SODIUM CHLORIDE 0.9 % IV SOLN: 500.0000 mL | Freq: Once | INTRAVENOUS | Status: DC

## 2023-07-03 NOTE — Progress Notes (Signed)
 To pacu, VSS. Report to [email or web address]

## 2023-07-03 NOTE — Patient Instructions (Signed)
  Resume previous diet Continue present medications Repeat colonoscopy in 5 years for surveillance Use fiber, for example.SABRASABRACitrucel, Fibercon, Konsyl or Metamucil Return to GI clinic as needed   YOU HAD AN ENDOSCOPIC PROCEDURE TODAY AT THE [facility]:   Refer to the procedure report that was given to you for any specific questions about what was found during the examination.  If the procedure report does not answer your questions, please call your gastroenterologist to clarify.  If you requested that your care partner not be given the details of your procedure findings, then the procedure report has been included in a sealed envelope for you to review at your convenience later.  YOU SHOULD EXPECT: Some feelings of bloating in the abdomen. Passage of more gas than usual.  Walking can help get rid of the air that was put into your GI tract during the procedure and reduce the bloating. If you had a lower endoscopy (such as a colonoscopy or flexible sigmoidoscopy) you may notice spotting of blood in your stool or on the toilet paper. If you underwent a bowel prep for your procedure, you may not have a normal bowel movement for a few days.  Please Note:  You might notice some irritation and congestion in your nose or some drainage.  This is from the oxygen used during your procedure.  There is no need for concern and it should clear up in a day or so.  SYMPTOMS TO REPORT IMMEDIATELY:  Following lower endoscopy (colonoscopy or flexible sigmoidoscopy):  Excessive amounts of blood in the stool  Significant tenderness or worsening of abdominal pains  Swelling of the abdomen that is new, acute  Fever of 100F or higher  For urgent or emergent issues, a gastroenterologist can be reached at any hour by calling [phone]. Do not use MyChart messaging for urgent concerns.    DIET:  We do recommend a small meal at first, but then you may proceed to your regular diet.  Drink plenty of  fluids but you should avoid alcoholic beverages for 24 hours.  ACTIVITY:  You should plan to take it easy for the rest of today and you should NOT DRIVE or use heavy machinery until tomorrow (because of the sedation medicines used during the test).    FOLLOW UP: Our staff will call the number listed on your records the next business day following your procedure.  We will call around 7:15- 8:00 am to check on you and address any questions or concerns that you may have regarding the information given to you following your procedure. If we do not reach you, we will leave a message.     If any biopsies were taken you will be contacted by phone or by letter within the next 1-3 weeks.  Please call us  at [phone] if you have not heard about the biopsies in 3 weeks.    SIGNATURES/CONFIDENTIALITY: You and/or your care partner have signed paperwork which will be entered into your electronic medical record.  These signatures attest to the fact that that the information above on your After Visit Summary has been reviewed and is understood.  Full responsibility of the confidentiality of this discharge information lies with you and/or your care-partner.

## 2023-07-03 NOTE — Op Note (Signed)
 [facility] Patient Name: Alvin White Procedure Date: 07/03/2023 10:23 AM MRN: [id number] Endoscopist: Sandor Flatter , MD, [id number] Age: 58 Referring MD:  Date of Birth: Dec 21, 1965 Gender: Male Account #: [id number] Procedure:                Colonoscopy Indications:              Surveillance: Personal history of adenomatous                            polyps on last colonoscopy 5 years ago                           Last colonoscopy was 07/2018 and notable for 18 mm                            pedunculated sigmoid tubulovillous adenoma, 5 mm                            transverse colon adenoma, 3 mm rectal adenoma,                            sigmoid diverticulosis with tortuous sigmoid colon,                            small internal hemorrhoids, normal TI. Medicines:                Monitored Anesthesia Care Procedure:                Pre-Anesthesia Assessment:                           - Prior to the procedure, a History and Physical                            was performed, and patient medications and                            allergies were reviewed. The patient's tolerance of                            previous anesthesia was also reviewed. The risks                            and benefits of the procedure and the sedation                            options and risks were discussed with the patient.                            All questions were answered, and informed consent                            was obtained. Prior Anticoagulants: The patient has  taken no anticoagulant or antiplatelet agents. ASA                            Grade Assessment: II - A patient with mild systemic                            disease. After reviewing the risks and benefits,                            the patient was deemed in satisfactory condition to                            undergo the procedure.                           After obtaining informed consent,  the colonoscope                            was passed under direct vision. Throughout the                            procedure, the patient's blood pressure, pulse, and                            oxygen saturations were monitored continuously. The                            Olympus Scope [id number] was introduced through the                            anus and advanced to the the cecum, identified by                            appendiceal orifice and ileocecal valve. The                            colonoscopy was performed without difficulty. The                            patient tolerated the procedure well. The quality                            of the bowel preparation was good. The ileocecal                            valve, appendiceal orifice, and rectum were                            photographed. Scope In: 10:36:20 AM Scope Out: 10:51:13 AM Scope Withdrawal Time: 0 hours 9 minutes 54 seconds  Total Procedure Duration: 0 hours 14 minutes 53 seconds  Findings:                 The perianal and digital rectal examinations were  normal.                           Multiple medium-mouthed and small-mouthed                            diverticula were found in the sigmoid colon.                           Non-bleeding internal hemorrhoids were found during                            retroflexion. The hemorrhoids were small and Grade                            II (internal hemorrhoids that prolapse but reduce                            spontaneously).                           The exam was otherwise normal throughout the                            remainder of the colon. Complications:            No immediate complications. Estimated Blood Loss:     Estimated blood loss: none. Impression:               - Diverticulosis in the sigmoid colon.                           - Non-bleeding internal hemorrhoids.                           - No specimens  collected. Recommendation:           - Patient has a contact number available for                            emergencies. The signs and symptoms of potential                            delayed complications were discussed with the                            patient. Return to normal activities tomorrow.                            Written discharge instructions were provided to the                            patient.                           - Resume previous diet.                           -  Continue present medications.                           - Repeat colonoscopy in 5 years for surveillance.                           - Use fiber, for example Citrucel, Fibercon, Konsyl                            or Metamucil.                           - Return to GI clinic PRN. Sandor Flatter, MD 07/03/2023 10:59:22 AM

## 2023-07-03 NOTE — Progress Notes (Signed)
 GASTROENTEROLOGY PROCEDURE H&P NOTE   Primary Care Physician: Dorina Loving, PA-C    Reason for Procedure:  Colon polyp surveillance  Plan:    Colonoscopy  Patient is appropriate for endoscopic procedure(s) in the ambulatory ([facility]) setting.  The nature of the procedure, as well as the risks, benefits, and alternatives were carefully and thoroughly reviewed with the patient. Ample time for discussion and questions allowed. The patient understood, was satisfied, and agreed to proceed.     HPI: Alvin White is a 58 y.o. male who presents for colonoscopy for ongoing colon polyp surveillance and colon cancer screening.  No active GI symptoms.  No known family history of colon cancer or related malignancy.  Patient is otherwise without complaints or active issues today.  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  Recommended repeat in 2 years.  Past Medical History:  Diagnosis Date   Allergy    Chicken pox    GERD (gastroesophageal reflux disease)     Past Surgical History:  Procedure Laterality Date   TONSILLECTOMY      Prior to Admission medications   Not on File    No current outpatient medications on file.   Current Facility-Administered Medications  Medication Dose Route Frequency Provider Last Rate Last Admin   0.9 %  sodium chloride  infusion  500 mL Intravenous Once Kristan Votta V, DO        Allergies as of 07/03/2023   (No Known Allergies)    Family History  Problem Relation Age of Onset   Arthritis Mother    Arthritis Father    Colon polyps Father    Esophageal cancer Neg Hx    Rectal cancer Neg Hx    Stomach cancer Neg Hx    Colon cancer Neg Hx     Social History   Socioeconomic History   Marital status: Married    Spouse name: Not on file    Number of children: Not on file   Years of education: Not on file   Highest education level: Not on file  Occupational History   Not on file  Tobacco Use   Smoking status: Never   Smokeless tobacco: Never  Vaping Use   Vaping status: Never Used  Substance and Sexual Activity   Alcohol use: Yes    Alcohol/week: 2.0 standard drinks of alcohol    Types: 2 Cans of beer per week    Comment: 2 beers 5 days a week.   Drug use: No   Sexual activity: Not on file  Other Topics Concern   Not on file  Social History Narrative   Not on file   Social Drivers of Health   Financial Resource Strain: Not on file  Food Insecurity: Not on file  Transportation Needs: Not on file  Physical Activity: Not on file  Stress: Not on file  Social Connections: Not on file  Intimate Partner Violence: Not on file    Physical Exam: Vital signs in last 24 hours: @BP  (!) 123/90   Pulse 82   Temp 99.1 F (37.3 C) (Temporal)   Resp 13   Ht 5' 10 (1.778 m)   Wt 200 lb (90.7 kg)   SpO2 96%   BMI 28.70 kg/m  GEN: NAD EYE: Sclerae anicteric ENT: MMM CV: Non-tachycardic Pulm: CTA b/l GI: Soft, NT/ND NEURO:  Alert & Oriented x 3   Sandor Flatter, DO [facility]   07/03/2023 10:28 AM

## 2023-07-03 NOTE — Progress Notes (Signed)
 Pt's states no medical or surgical changes since previsit or office visit.

## 2023-07-04 ENCOUNTER — Telehealth: Payer: Self-pay

## 2023-07-04 NOTE — Telephone Encounter (Signed)
 Attempted to reach patient for follow up phone call. No answer, left voicemail to contact Dr. Andre Kawasaki office with any questions or concerns.
